# Patient Record
Sex: Male | Born: 1990 | Race: White | Hispanic: No | Marital: Single | Smoking: Current every day smoker
Health system: Southern US, Community
[De-identification: ages and names within clinical notes are randomized; demographics above are authoritative.]

---

## 2012-10-12 ENCOUNTER — Encounter (HOSPITAL_COMMUNITY): Payer: Self-pay | Admitting: Emergency Medicine

## 2012-10-12 ENCOUNTER — Emergency Department (HOSPITAL_COMMUNITY): Payer: 59

## 2012-10-12 ENCOUNTER — Emergency Department (HOSPITAL_COMMUNITY)
Admission: EM | Admit: 2012-10-12 | Discharge: 2012-10-12 | Disposition: A | Discharge status: Home / Self Care | Payer: 59 | Attending: Emergency Medicine | Admitting: Emergency Medicine

## 2012-10-12 DIAGNOSIS — S8992XA Unspecified injury of left lower leg, initial encounter: Secondary | ICD-10-CM

## 2012-10-12 DIAGNOSIS — X500XXA Overexertion from strenuous movement or load, initial encounter: Secondary | ICD-10-CM | POA: Insufficient documentation

## 2012-10-12 DIAGNOSIS — F17200 Nicotine dependence, unspecified, uncomplicated: Secondary | ICD-10-CM | POA: Insufficient documentation

## 2012-10-12 DIAGNOSIS — S8990XA Unspecified injury of unspecified lower leg, initial encounter: Secondary | ICD-10-CM | POA: Insufficient documentation

## 2012-10-12 DIAGNOSIS — Y9361 Activity, american tackle football: Secondary | ICD-10-CM | POA: Insufficient documentation

## 2012-10-12 DIAGNOSIS — Y9239 Other specified sports and athletic area as the place of occurrence of the external cause: Secondary | ICD-10-CM | POA: Insufficient documentation

## 2012-10-12 MED ORDER — OXYCODONE-ACETAMINOPHEN 5-325 MG PO TABS
1.0000 | ORAL_TABLET | Freq: Once | ORAL | Status: AC
  Administered 2012-10-12: 1 via ORAL
  Filled 2012-10-12: qty 1

## 2012-10-12 MED ORDER — HYDROCODONE-ACETAMINOPHEN 5-325 MG PO TABS: 1.0000 | ORAL_TABLET | ORAL | Status: AC | PRN

## 2012-10-12 MED ORDER — OXYCODONE-ACETAMINOPHEN 5-325 MG PO TABS: 2.0000 | ORAL_TABLET | Freq: Once | ORAL | Status: DC

## 2012-10-12 MED ORDER — ONDANSETRON 4 MG PO TBDP
4.0000 mg | ORAL_TABLET | Freq: Once | ORAL | Status: AC
  Administered 2012-10-12: 4 mg via ORAL
  Filled 2012-10-12: qty 1

## 2012-10-12 NOTE — ED Provider Notes (Signed)
Medical screening examination/treatment/procedure(s) were performed by non-physician practitioner and as supervising physician I was immediately available for consultation/collaboration.   Dagmar Hait, MD 10/12/12 [phone]

## 2012-10-12 NOTE — ED Provider Notes (Signed)
CSN: [id number]     Arrival date & time 10/12/12  2114 History  This chart was scribed for non-physician practitioner Marlon Pel, PA-C working with Dagmar Hait, MD by Danella Maiers, ED Scribe. This patient was seen in room TR10C/TR10C and the patient's care was started at 9:58 PM.   Chief Complaint  Patient presents with  . Knee Pain   Patient is a 22 y.o. male presenting with knee pain. The history is provided by the patient. No language interpreter was used.  Knee Pain  HPI Comments: Prather Failla is a 22 y.o. male who presents to the Emergency Department complaining of sudden-onset, worsening left knee pain after hearing a "pop" while planting his left leg while playing football yesterday. He states he is unable to walk and straighten the leg all the way. He has been taking ibuprofen for the pain. He denies numbness. He denies hitting his head and LOC. He has been seen by his athletic trainers who tell him they dont know whats going on but he should get his knee checked out.  History reviewed. No pertinent past medical history. History reviewed. No pertinent past surgical history. No family history on file. History  Substance Use Topics  . Smoking status: Current Every Day Smoker  . Smokeless tobacco: Not on file  . Alcohol Use: No    Review of Systems  Musculoskeletal: Positive for arthralgias (left knee).  All other systems reviewed and are negative.    Allergies  Review of patient's allergies indicates no known allergies.  Home Medications   Current Outpatient Rx  Name  Route  Sig  Dispense  Refill  . acetaminophen (TYLENOL) 500 MG tablet   Oral   Take 500 mg by mouth every 6 (six) hours as needed for pain (pain).          BP 138/94  Pulse 80  Temp(Src) 98.1 F (36.7 C) (Oral)  Resp 16  SpO2 99% Physical Exam  Nursing note and vitals reviewed. Constitutional: He is oriented to person, place, and time. He appears well-developed and  well-nourished. No distress.  HENT:  Head: Normocephalic and atraumatic.  Eyes: EOM are normal.  Neck: Neck supple. No tracheal deviation present.  Cardiovascular: Normal rate.   Pulmonary/Chest: Effort normal. No respiratory distress.  Musculoskeletal:       Left knee: He exhibits decreased range of motion, swelling and effusion. He exhibits no deformity and no laceration. Tenderness found. Lateral joint line tenderness noted.  Neurological: He is alert and oriented to person, place, and time.  Skin: Skin is warm and dry.  Psychiatric: He has a normal mood and affect. His behavior is normal.    ED Course  Procedures (including critical care time) Medications  ondansetron (ZOFRAN-ODT) disintegrating tablet 4 mg (not administered)  oxyCODONE-acetaminophen (PERCOCET/ROXICET) 5-325 MG per tablet 1 tablet (not administered)    DIAGNOSTIC STUDIES: Oxygen Saturation is 99% on RA, normal by my interpretation.    COORDINATION OF CARE: 11:08 PM- Discussed treatment plan with pt which includes a knee immobilizer, treatment with Vicodin and ibuprofen, referral to an orthopedist, and advising the pt to ice the area. Pt agrees to plan.    Labs Review Labs Reviewed - No data to display Imaging Review Dg Knee Complete 4 Views Left  10/12/2012   CLINICAL DATA:  Knee pain. Football injury.  EXAM: LEFT KNEE - COMPLETE 4+ VIEW  COMPARISON:  9  FINDINGS: Large joint effusion within the left knee. There is stranding within the infrapatellar  fat pad. Irregularity noted along the ow medial aspect of the lateral femoral condyle. Cannot exclude ACL injury. No additional acute bony abnormality.  IMPRESSION: Large joint effusion with irregularity on the medial aspect of the lateral femoral condyle. This can be seen with ACL injury.   Electronically Signed   By: Charlett Nose M.D.   On: 10/12/2012 22:07    MDM  No diagnosis found.   Dx: knee injury  Suspicion for ACL tear given xray results and physical  exam.  Will need outpatient MRI. He can use team orthopedist or the ortho doc referred to him.  Rx: Vicodin and Motrin, RICE  22 y.o.Tyr Kishbaugh's evaluation in the Emergency Department is complete. It has been determined that no acute conditions requiring further emergency intervention are present at this time. The patient/guardian have been advised of the diagnosis and plan. We have discussed signs and symptoms that warrant return to the ED, such as changes or worsening in symptoms.  Vital signs are stable at discharge. Filed Vitals:   10/12/12 2122  BP: 138/94  Pulse: 80  Temp: 98.1 F (36.7 C)  Resp: 16    Patient/guardian has voiced understanding and agreed to follow-up with the PCP or specialist.  I personally performed the services described in this documentation, which was scribed in my presence. The recorded information has been reviewed and is accurate.       Dorthula Matas, PA-C 10/12/12 2316

## 2012-10-12 NOTE — ED Notes (Signed)
C/o L knee pain.  Felt L knee pop while playing football on Saturday.

## 2012-10-13 ENCOUNTER — Other Ambulatory Visit (HOSPITAL_COMMUNITY): Payer: Self-pay | Admitting: Family Medicine

## 2012-10-13 DIAGNOSIS — M25562 Pain in left knee: Secondary | ICD-10-CM

## 2014-05-27 IMAGING — CR DG KNEE COMPLETE 4+V*L*
4 series · 4 of 4 positions shown · non-contrast
Comparison: 9

CLINICAL DATA: Knee pain. Football injury.

EXAM:
LEFT KNEE - COMPLETE 4+ VIEW

[x knee ap left]
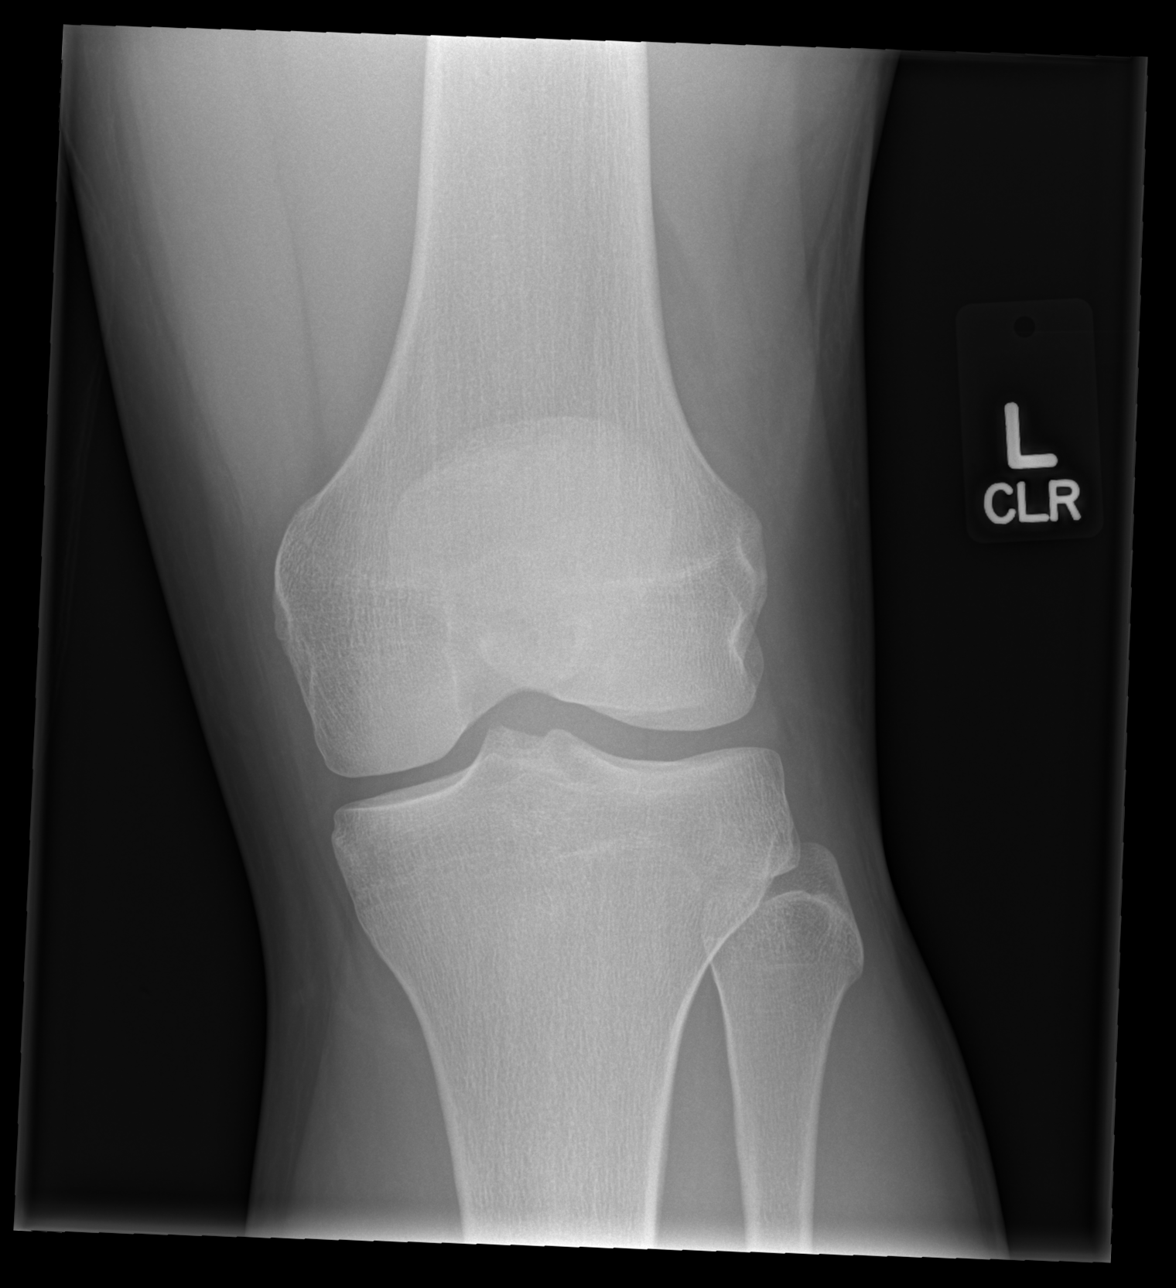

[x knee obl left (1 of 2)]
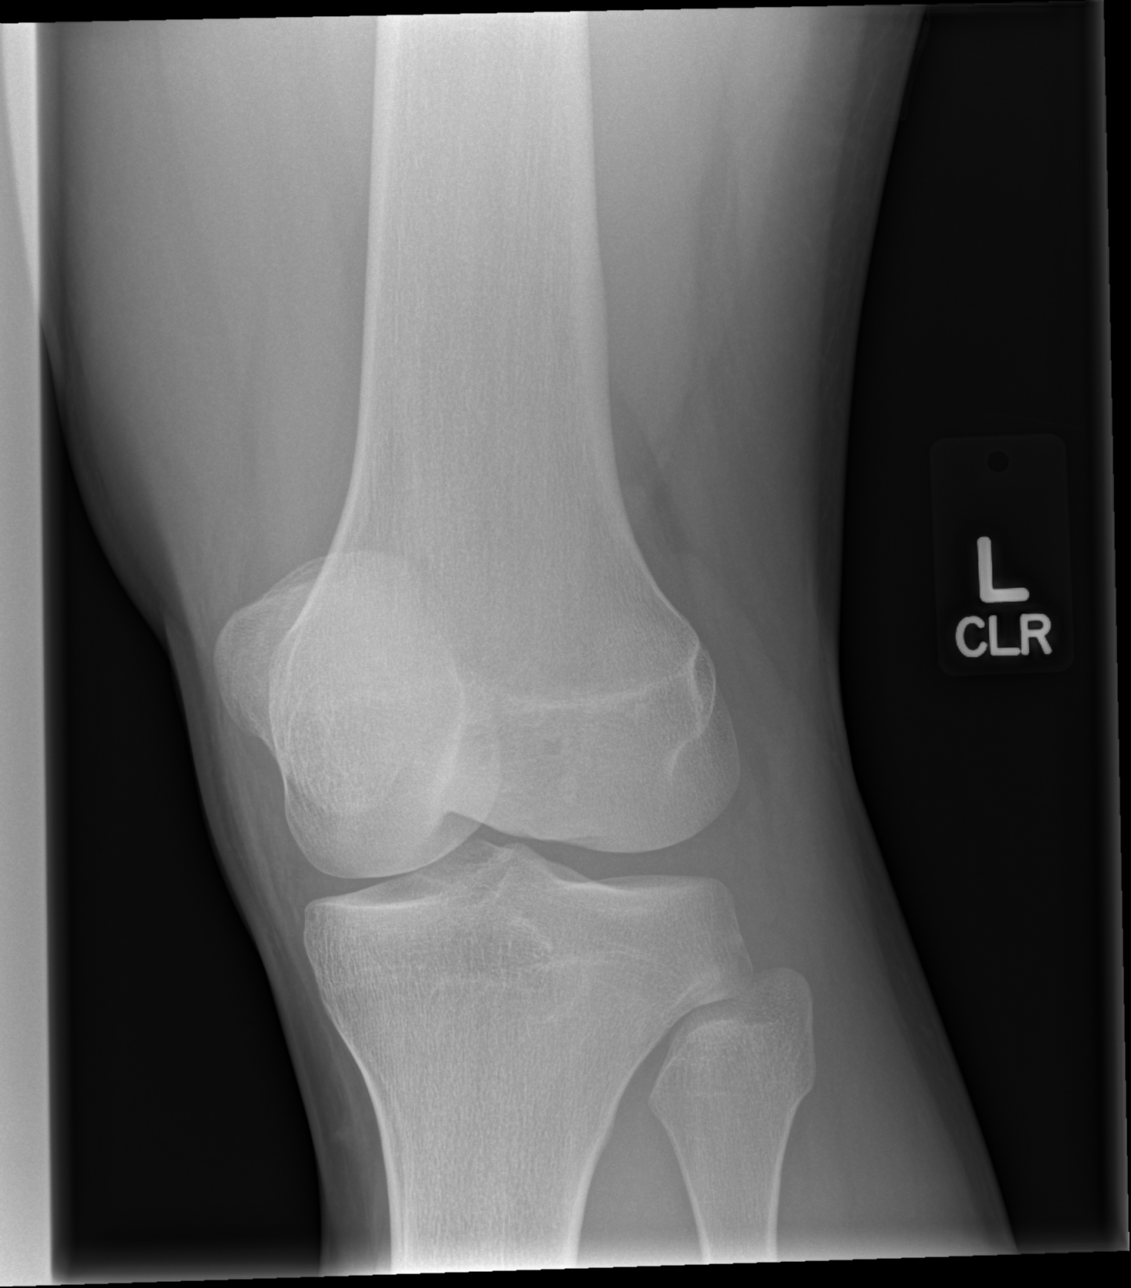

[x knee obl left (2 of 2)]
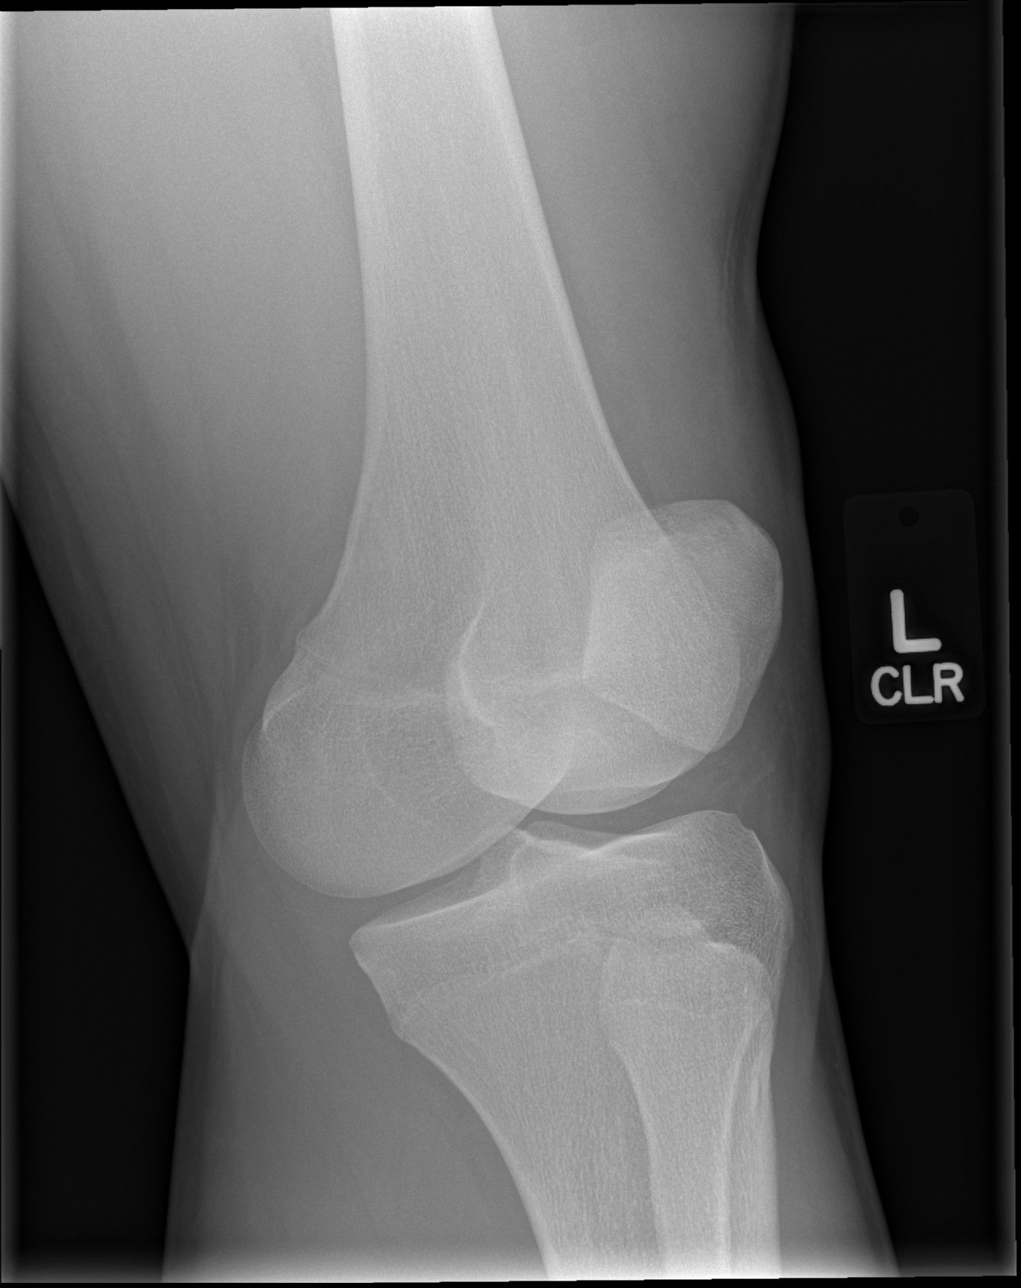

[x knee lat left]
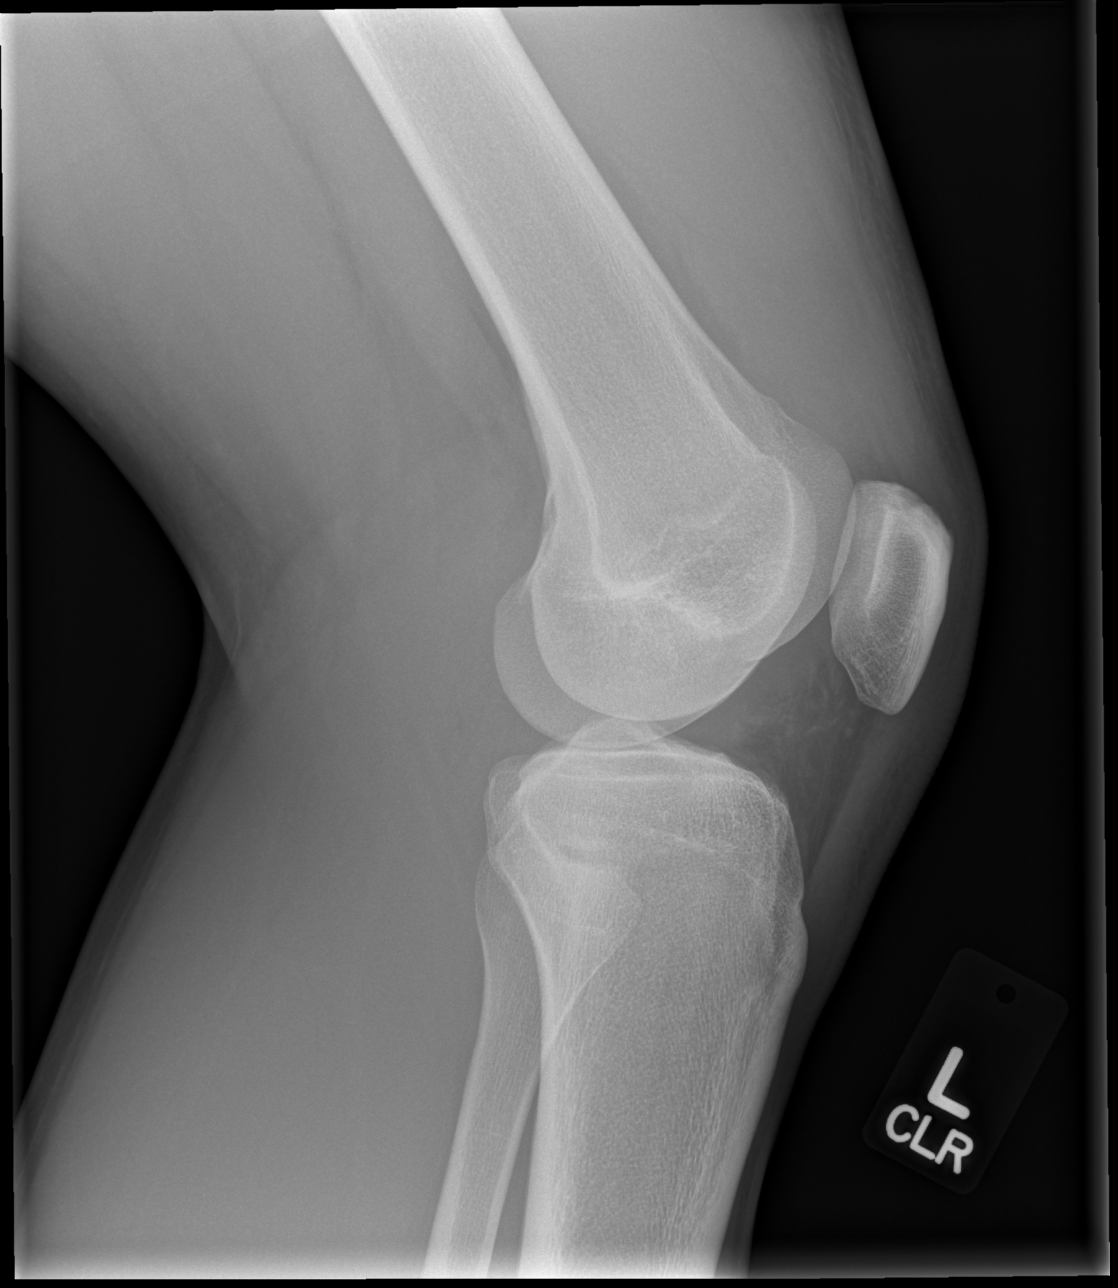

[4 of 4 positions shown; findings below may reference images not displayed]

FINDINGS: Large joint effusion within the left knee. There is stranding within
the infrapatellar fat pad. Irregularity noted along the ow medial
aspect of the lateral femoral condyle. Cannot exclude ACL injury. No
additional acute bony abnormality.
IMPRESSION: Large joint effusion with irregularity on the medial aspect of the
lateral femoral condyle. This can be seen with ACL injury.

## 2018-10-23 ENCOUNTER — Other Ambulatory Visit: Payer: Self-pay | Admitting: Registered Nurse

## 2018-10-23 DIAGNOSIS — E041 Nontoxic single thyroid nodule: Secondary | ICD-10-CM

## 2018-10-30 ENCOUNTER — Ambulatory Visit
Admission: RE | Admit: 2018-10-30 | Discharge: 2018-10-30 | Disposition: A | Discharge status: Home / Self Care | Payer: No Typology Code available for payment source | Source: Ambulatory Visit | Attending: Registered Nurse | Admitting: Registered Nurse

## 2018-10-30 DIAGNOSIS — E041 Nontoxic single thyroid nodule: Secondary | ICD-10-CM

## 2019-06-04 ENCOUNTER — Ambulatory Visit: Payer: Self-pay | Attending: Family

## 2019-06-04 DIAGNOSIS — Z23 Encounter for immunization: Secondary | ICD-10-CM

## 2019-06-04 NOTE — Progress Notes (Signed)
   Covid-19 Vaccination Clinic  Name:  Jesus Jackson    MRN: [id number] DOB: [birth year]  06/04/2019  [name] was observed post Covid-19 immunization for 15 minutes without incident. He was provided with Vaccine Information Sheet and instruction to access the V-Safe system.   [name] was instructed to call 911 with any severe reactions post vaccine: Marland Kitchen Difficulty breathing  . Swelling of face and throat  . A fast heartbeat  . A bad rash all over body  . Dizziness and weakness   Immunizations Administered    Name Date Dose VIS Date Route   Moderna COVID-19 Vaccine 06/04/2019  1:14 PM 0.5 mL 12/2018 Intramuscular   Manufacturer: Moderna   Lot: [id number]   NDC: 34621-947-12

## 2019-08-20 ENCOUNTER — Other Ambulatory Visit: Payer: Self-pay | Admitting: Family

## 2019-08-20 DIAGNOSIS — R748 Abnormal levels of other serum enzymes: Secondary | ICD-10-CM

## 2019-08-20 DIAGNOSIS — R109 Unspecified abdominal pain: Secondary | ICD-10-CM

## 2019-08-20 DIAGNOSIS — R161 Splenomegaly, not elsewhere classified: Secondary | ICD-10-CM

## 2019-08-21 ENCOUNTER — Ambulatory Visit
Admission: RE | Admit: 2019-08-21 | Discharge: 2019-08-21 | Disposition: A | Discharge status: Home / Self Care | Payer: Self-pay | Source: Ambulatory Visit | Attending: Family | Admitting: Family

## 2019-08-21 DIAGNOSIS — R109 Unspecified abdominal pain: Secondary | ICD-10-CM

## 2019-08-21 DIAGNOSIS — R748 Abnormal levels of other serum enzymes: Secondary | ICD-10-CM

## 2019-08-21 DIAGNOSIS — R161 Splenomegaly, not elsewhere classified: Secondary | ICD-10-CM

## 2021-04-04 IMAGING — US US ABDOMEN COMPLETE
1 series · 14 of 25 positions shown · non-contrast
Comparison: None.

CLINICAL DATA: Elevated LFTs of, abdominal pain

EXAM:
ABDOMEN ULTRASOUND COMPLETE

[Series 1: us abdomen complete · 0.23mm/px · 14 of 82 slices shown]
[im 1/82]
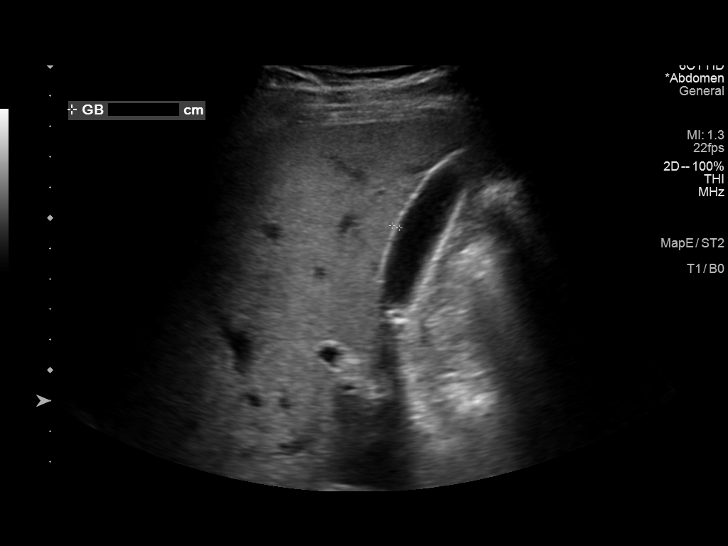
[im 7/82]
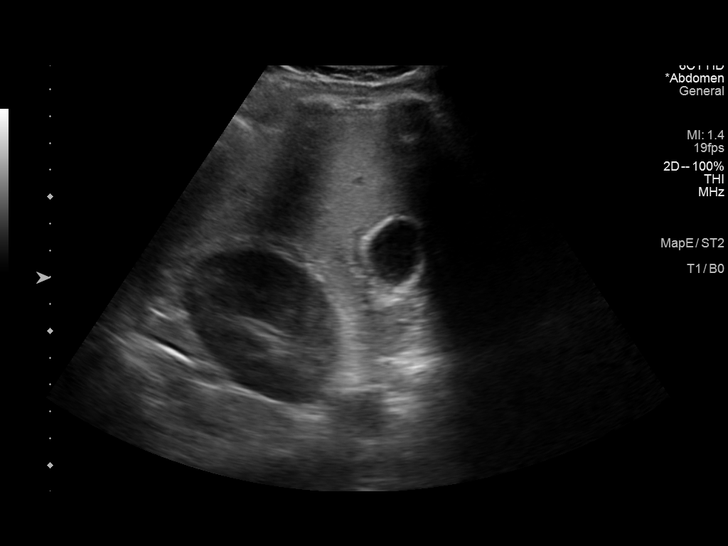
[im 14/82]
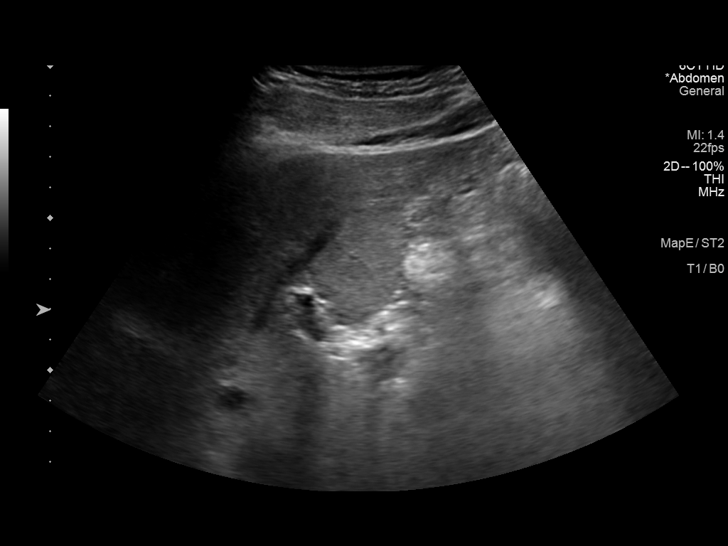
[im 21/82]
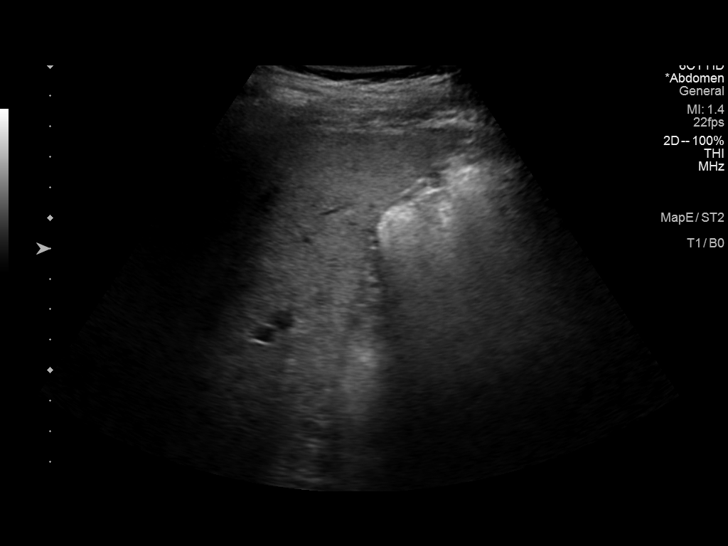
[im 28/82]
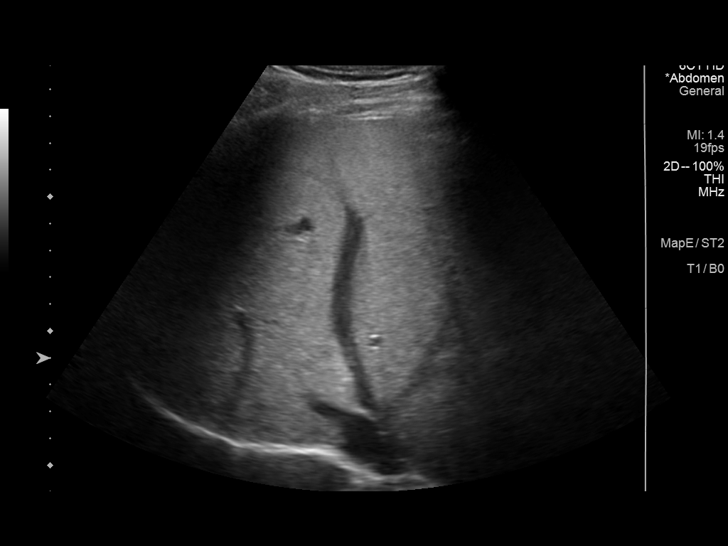
[im 31/82]
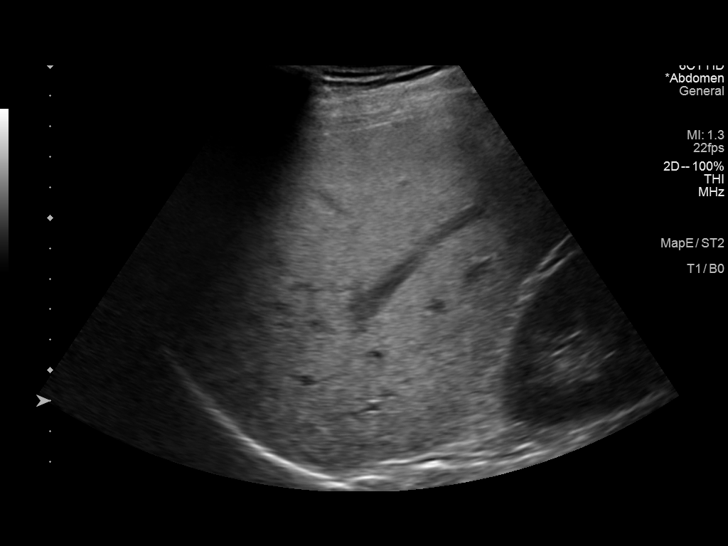
[im 38/82]
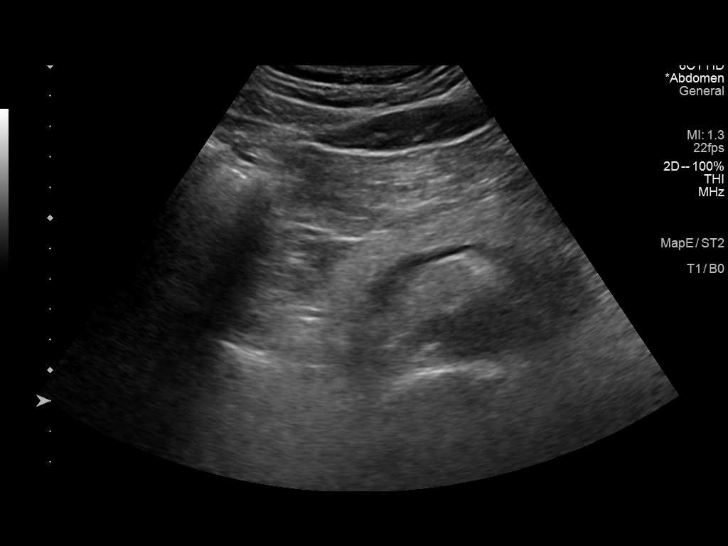
[im 44/82]
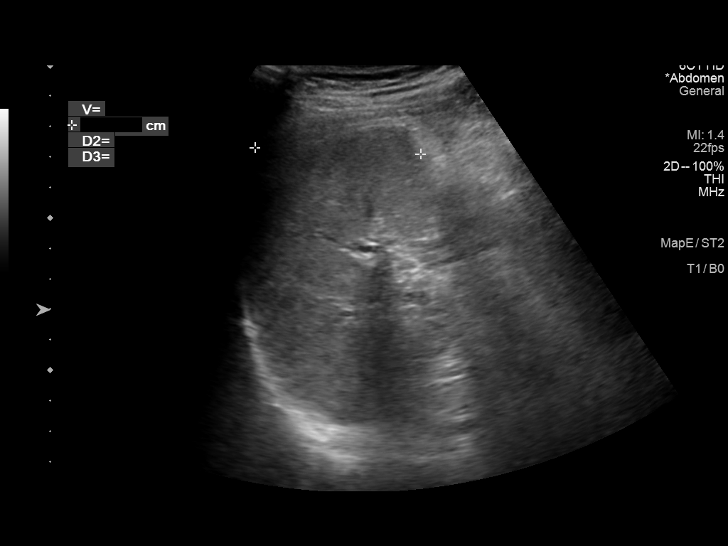
[im 51/82]
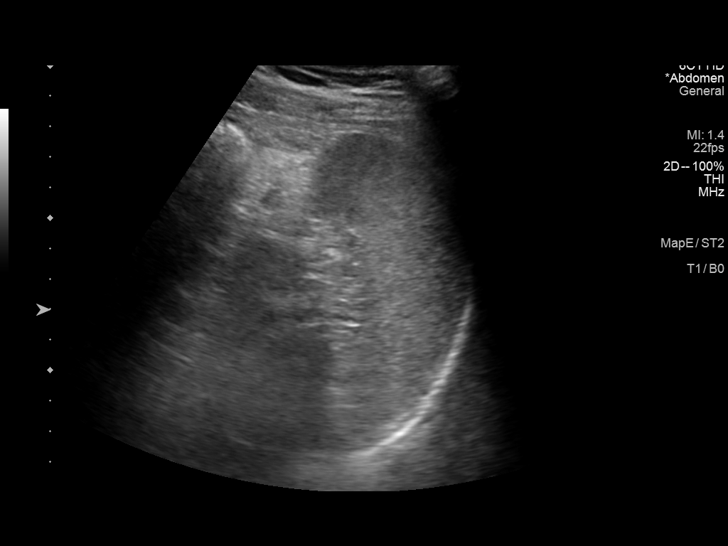
[im 55/82]
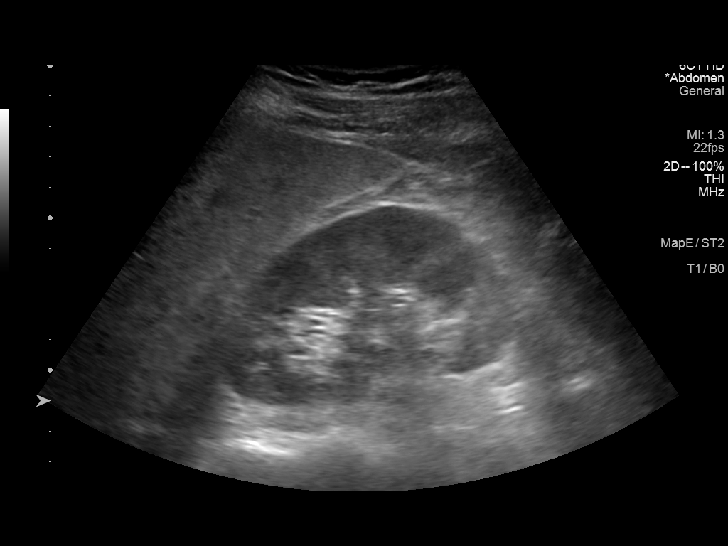
[im 61/82]
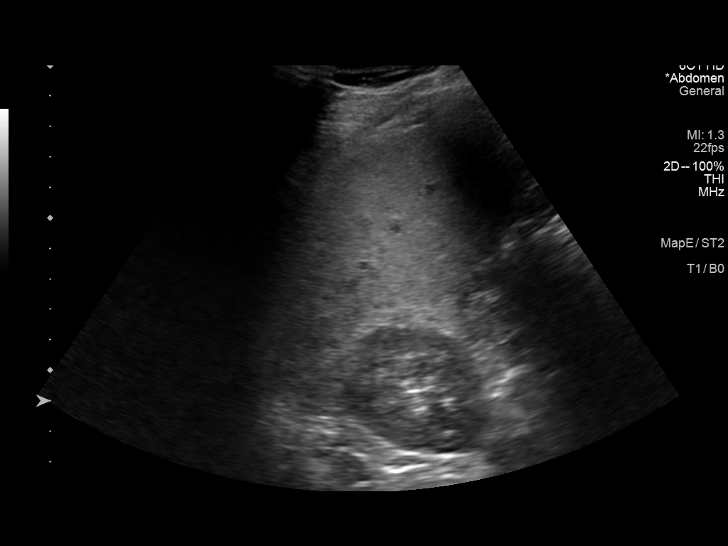
[im 68/82]
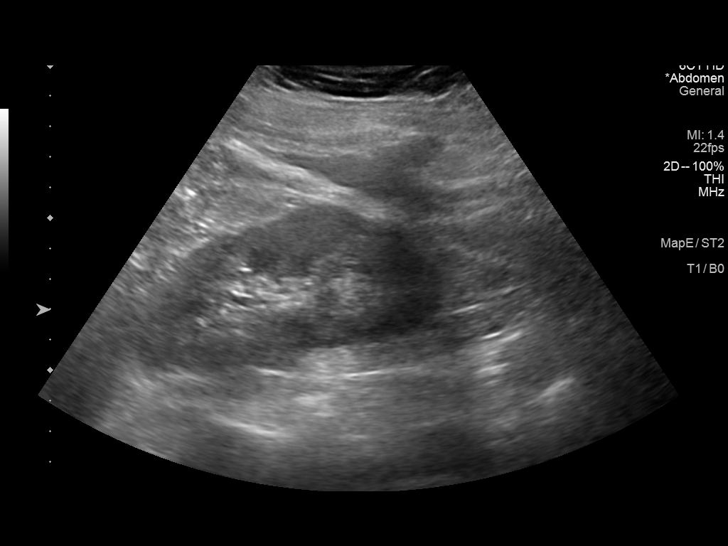
[im 75/82]
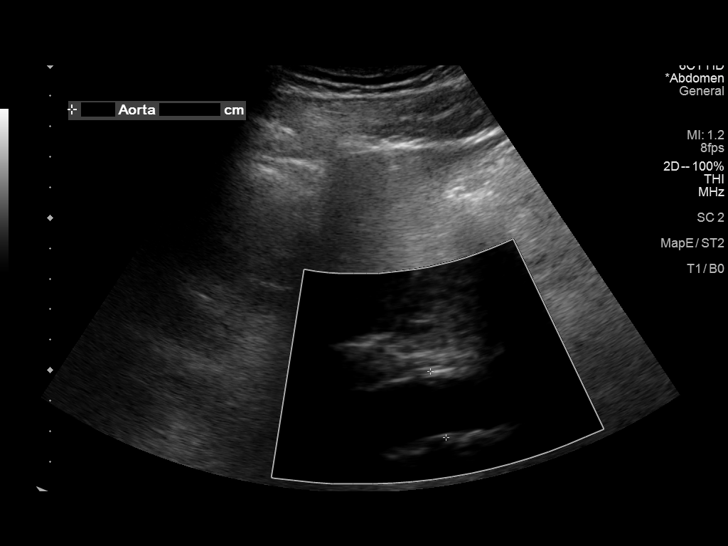
[im 82/82]
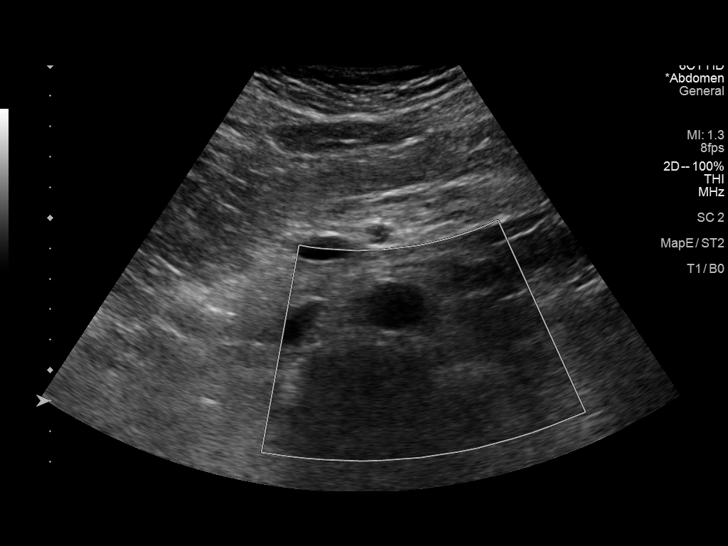

[14 of 25 positions shown; findings below may reference images not displayed]

FINDINGS: Gallbladder: No gallstones or wall thickening visualized. No
sonographic Murphy sign noted by sonographer.

Common bile duct: Diameter: 2 mm, normal

Liver: Diffusely increased hepatic echogenicity. No focal lesion
identified. Portal vein is patent on color Doppler imaging with
normal direction of blood flow towards the liver.

IVC: No abnormality visualized.

Pancreas: Visualized portion unremarkable.

Spleen: Size and appearance within normal limits. It measures 5.4 x
9.8 x 3.4 cm for a volume of 109 mL.

Right Kidney: Length: 11.7 cm. Echogenicity within normal limits. No
mass or hydronephrosis visualized.

Left Kidney: Length: 10.7 cm. Echogenicity within normal limits. No
mass or hydronephrosis visualized.

Abdominal aorta: No aneurysm visualized. The aorta measures 2.2 cm
proximally.

Other findings: None.
IMPRESSION: Hepatic steatosis.
